# Patient Record
Sex: Female | Born: 1985 | Hispanic: No | Marital: Single | Smoking: Never smoker
Health system: Southern US, Community
[De-identification: ages and names within clinical notes are randomized; demographics above are authoritative.]

## PROBLEM LIST (undated history)

## (undated) DIAGNOSIS — IMO0002 Reserved for concepts with insufficient information to code with codable children: Secondary | ICD-10-CM

## (undated) DIAGNOSIS — R87619 Unspecified abnormal cytological findings in specimens from cervix uteri: Secondary | ICD-10-CM

## (undated) HISTORY — PX: WISDOM TOOTH EXTRACTION: SHX21

## (undated) HISTORY — DX: Unspecified abnormal cytological findings in specimens from cervix uteri: R87.619

## (undated) HISTORY — DX: Reserved for concepts with insufficient information to code with codable children: IMO0002

---

## 2012-11-13 ENCOUNTER — Other Ambulatory Visit (HOSPITAL_COMMUNITY): Payer: Self-pay | Admitting: Nurse Practitioner

## 2012-11-13 DIAGNOSIS — Z3682 Encounter for antenatal screening for nuchal translucency: Secondary | ICD-10-CM

## 2012-11-13 LAB — OB RESULTS CONSOLE ABO/RH: RH Type: POSITIVE

## 2012-11-13 LAB — OB RESULTS CONSOLE HIV ANTIBODY (ROUTINE TESTING): HIV: NONREACTIVE

## 2012-11-13 LAB — OB RESULTS CONSOLE GC/CHLAMYDIA: Chlamydia: NEGATIVE

## 2012-11-13 LAB — OB RESULTS CONSOLE RPR: RPR: NONREACTIVE

## 2012-11-21 ENCOUNTER — Ambulatory Visit (HOSPITAL_COMMUNITY): Admission: RE | Admit: 2012-11-21 | Payer: Medicaid Other | Source: Ambulatory Visit

## 2012-11-21 ENCOUNTER — Encounter (HOSPITAL_COMMUNITY): Payer: Self-pay

## 2012-11-21 ENCOUNTER — Ambulatory Visit (HOSPITAL_COMMUNITY)
Admission: RE | Admit: 2012-11-21 | Discharge: 2012-11-21 | Disposition: A | Discharge status: Home / Self Care | Payer: Medicaid Other | Source: Ambulatory Visit | Attending: Nurse Practitioner | Admitting: Nurse Practitioner

## 2012-11-21 ENCOUNTER — Other Ambulatory Visit (HOSPITAL_COMMUNITY): Payer: Self-pay | Admitting: Nurse Practitioner

## 2012-11-21 DIAGNOSIS — Z3682 Encounter for antenatal screening for nuchal translucency: Secondary | ICD-10-CM

## 2012-11-21 DIAGNOSIS — O351XX0 Maternal care for (suspected) chromosomal abnormality in fetus, not applicable or unspecified: Secondary | ICD-10-CM | POA: Insufficient documentation

## 2012-11-21 DIAGNOSIS — O3510X0 Maternal care for (suspected) chromosomal abnormality in fetus, unspecified, not applicable or unspecified: Secondary | ICD-10-CM | POA: Insufficient documentation

## 2012-11-21 DIAGNOSIS — Z3689 Encounter for other specified antenatal screening: Secondary | ICD-10-CM | POA: Insufficient documentation

## 2012-11-21 NOTE — Progress Notes (Signed)
Morgan Washington was seen for ultrasound appointment today.  Please see [facility] report for details.

## 2012-11-23 ENCOUNTER — Other Ambulatory Visit (HOSPITAL_COMMUNITY): Payer: Self-pay | Admitting: Maternal and Fetal Medicine

## 2012-11-23 DIAGNOSIS — Z3682 Encounter for antenatal screening for nuchal translucency: Secondary | ICD-10-CM

## 2012-11-24 ENCOUNTER — Ambulatory Visit (HOSPITAL_COMMUNITY)
Admission: RE | Admit: 2012-11-24 | Discharge: 2012-11-24 | Disposition: A | Discharge status: Home / Self Care | Payer: Medicaid Other | Source: Ambulatory Visit | Attending: Nurse Practitioner | Admitting: Nurse Practitioner

## 2012-11-24 ENCOUNTER — Other Ambulatory Visit: Payer: Self-pay

## 2012-11-24 DIAGNOSIS — O351XX0 Maternal care for (suspected) chromosomal abnormality in fetus, not applicable or unspecified: Secondary | ICD-10-CM | POA: Insufficient documentation

## 2012-11-24 DIAGNOSIS — O3510X0 Maternal care for (suspected) chromosomal abnormality in fetus, unspecified, not applicable or unspecified: Secondary | ICD-10-CM | POA: Insufficient documentation

## 2012-11-24 DIAGNOSIS — Z3682 Encounter for antenatal screening for nuchal translucency: Secondary | ICD-10-CM

## 2012-11-24 DIAGNOSIS — Z3689 Encounter for other specified antenatal screening: Secondary | ICD-10-CM | POA: Insufficient documentation

## 2012-12-13 NOTE — L&D Delivery Note (Signed)
Delivery Note At 7:07 PM a viable female was delivered via Vaginal, Spontaneous Delivery (Presentation: ; Occiput Anterior).  APGAR: 9, 9; weight pending.   Placenta status: Intact, Spontaneous.  Cord: 3 vessels with the following complications: None.  Cord pH: n/a  Anesthesia: Epidural  Episiotomy: None Lacerations: 1st degree;Perineal;Vaginal Suture Repair: 3.0 vicryl rapide Est. Blood Loss (mL): 350  Mom to postpartum.  Baby to nursery-stable.  Napoleon Form 06/10/2013, 7:59 PM

## 2012-12-25 ENCOUNTER — Other Ambulatory Visit: Payer: Self-pay | Admitting: Nurse Practitioner

## 2012-12-25 DIAGNOSIS — Z3689 Encounter for other specified antenatal screening: Secondary | ICD-10-CM

## 2013-01-03 ENCOUNTER — Ambulatory Visit (HOSPITAL_COMMUNITY)
Admission: RE | Admit: 2013-01-03 | Discharge: 2013-01-03 | Disposition: A | Discharge status: Home / Self Care | Payer: 59 | Source: Ambulatory Visit | Attending: Nurse Practitioner | Admitting: Nurse Practitioner

## 2013-01-03 DIAGNOSIS — Z363 Encounter for antenatal screening for malformations: Secondary | ICD-10-CM | POA: Insufficient documentation

## 2013-01-03 DIAGNOSIS — O358XX0 Maternal care for other (suspected) fetal abnormality and damage, not applicable or unspecified: Secondary | ICD-10-CM | POA: Insufficient documentation

## 2013-01-03 DIAGNOSIS — Z1389 Encounter for screening for other disorder: Secondary | ICD-10-CM | POA: Insufficient documentation

## 2013-01-03 DIAGNOSIS — Z3689 Encounter for other specified antenatal screening: Secondary | ICD-10-CM

## 2013-01-05 ENCOUNTER — Other Ambulatory Visit (HOSPITAL_COMMUNITY): Payer: Self-pay | Admitting: Nurse Practitioner

## 2013-01-05 DIAGNOSIS — O358XX0 Maternal care for other (suspected) fetal abnormality and damage, not applicable or unspecified: Secondary | ICD-10-CM

## 2013-01-17 ENCOUNTER — Ambulatory Visit (HOSPITAL_COMMUNITY)
Admission: RE | Admit: 2013-01-17 | Discharge: 2013-01-17 | Disposition: A | Discharge status: Home / Self Care | Payer: 59 | Source: Ambulatory Visit | Attending: Nurse Practitioner | Admitting: Nurse Practitioner

## 2013-01-17 DIAGNOSIS — O358XX0 Maternal care for other (suspected) fetal abnormality and damage, not applicable or unspecified: Secondary | ICD-10-CM

## 2013-01-17 DIAGNOSIS — Z363 Encounter for antenatal screening for malformations: Secondary | ICD-10-CM | POA: Insufficient documentation

## 2013-01-17 DIAGNOSIS — Z1389 Encounter for screening for other disorder: Secondary | ICD-10-CM | POA: Insufficient documentation

## 2013-05-10 LAB — OB RESULTS CONSOLE GBS: GBS: NEGATIVE

## 2013-05-30 ENCOUNTER — Inpatient Hospital Stay (HOSPITAL_COMMUNITY): Admission: AD | Admit: 2013-05-30 | Payer: Self-pay | Source: Ambulatory Visit | Admitting: Obstetrics & Gynecology

## 2013-05-30 ENCOUNTER — Encounter (HOSPITAL_COMMUNITY): Payer: Self-pay | Admitting: *Deleted

## 2013-05-30 ENCOUNTER — Telehealth (HOSPITAL_COMMUNITY): Payer: Self-pay | Admitting: *Deleted

## 2013-05-30 NOTE — Telephone Encounter (Signed)
Preadmission screen  

## 2013-05-31 ENCOUNTER — Other Ambulatory Visit (HOSPITAL_COMMUNITY): Payer: Self-pay | Admitting: Physician Assistant

## 2013-05-31 DIAGNOSIS — O480 Post-term pregnancy: Secondary | ICD-10-CM

## 2013-06-01 ENCOUNTER — Ambulatory Visit (INDEPENDENT_AMBULATORY_CARE_PROVIDER_SITE_OTHER): Payer: Medicaid Other | Admitting: *Deleted

## 2013-06-01 ENCOUNTER — Ambulatory Visit (HOSPITAL_COMMUNITY)
Admission: RE | Admit: 2013-06-01 | Discharge: 2013-06-01 | Disposition: A | Discharge status: Home / Self Care | Payer: Medicaid Other | Source: Ambulatory Visit | Attending: Obstetrics & Gynecology | Admitting: Obstetrics & Gynecology

## 2013-06-01 VITALS — BP 124/71 | Temp 97.7°F | Wt 203.9 lb

## 2013-06-01 DIAGNOSIS — O480 Post-term pregnancy: Secondary | ICD-10-CM

## 2013-06-01 DIAGNOSIS — Z3689 Encounter for other specified antenatal screening: Secondary | ICD-10-CM | POA: Insufficient documentation

## 2013-06-01 NOTE — Progress Notes (Signed)
P=83, here for NST for postdates

## 2013-06-07 ENCOUNTER — Encounter (HOSPITAL_COMMUNITY): Payer: Self-pay

## 2013-06-07 ENCOUNTER — Ambulatory Visit (HOSPITAL_COMMUNITY)
Admission: RE | Admit: 2013-06-07 | Discharge: 2013-06-07 | Disposition: A | Discharge status: Home / Self Care | Payer: 59 | Source: Ambulatory Visit | Attending: Physician Assistant | Admitting: Physician Assistant

## 2013-06-07 DIAGNOSIS — O480 Post-term pregnancy: Secondary | ICD-10-CM

## 2013-06-07 DIAGNOSIS — Z3689 Encounter for other specified antenatal screening: Secondary | ICD-10-CM | POA: Insufficient documentation

## 2013-06-09 ENCOUNTER — Encounter (HOSPITAL_COMMUNITY): Payer: Self-pay

## 2013-06-09 ENCOUNTER — Inpatient Hospital Stay (HOSPITAL_COMMUNITY)
Admission: RE | Admit: 2013-06-09 | Discharge: 2013-06-11 | DRG: 775 | Disposition: A | Discharge status: Home / Self Care | Payer: 59 | Source: Ambulatory Visit | Attending: Obstetrics and Gynecology | Admitting: Obstetrics and Gynecology

## 2013-06-09 DIAGNOSIS — O480 Post-term pregnancy: Principal | ICD-10-CM | POA: Diagnosis present

## 2013-06-09 LAB — CBC
MCHC: 34.5 g/dL (ref 30.0–36.0)
Platelets: 253 10*3/uL (ref 150–400)
RDW: 12.4 % (ref 11.5–15.5)

## 2013-06-09 LAB — TYPE AND SCREEN
ABO/RH(D): O POS
Antibody Screen: NEGATIVE

## 2013-06-09 LAB — COMPREHENSIVE METABOLIC PANEL
CO2: 21 mEq/L (ref 19–32)
Calcium: 9.8 mg/dL (ref 8.4–10.5)
Chloride: 101 mEq/L (ref 96–112)
Creatinine, Ser: 0.66 mg/dL (ref 0.50–1.10)
GFR calc Af Amer: >90 mL/min (ref >90)
GFR calc non Af Amer: >90 mL/min (ref >90)
Glucose, Bld: 82 mg/dL (ref 70–99)
Total Bilirubin: 0.3 mg/dL (ref 0.3–1.2)

## 2013-06-09 MED ORDER — PHENYLEPHRINE 40 MCG/ML (10ML) SYRINGE FOR IV PUSH (FOR BLOOD PRESSURE SUPPORT)
80.0000 ug | PREFILLED_SYRINGE | INTRAVENOUS | Status: DC | PRN
  Filled 2013-06-09: qty 5
  Filled 2013-06-09: qty 2

## 2013-06-09 MED ORDER — OXYCODONE-ACETAMINOPHEN 5-325 MG PO TABS
1.0000 | ORAL_TABLET | ORAL | Status: DC | PRN
  Administered 2013-06-10: 1 via ORAL
  Filled 2013-06-09: qty 1

## 2013-06-09 MED ORDER — IBUPROFEN 600 MG PO TABS: 600.0000 mg | ORAL_TABLET | Freq: Four times a day (QID) | ORAL | Status: DC | PRN

## 2013-06-09 MED ORDER — LACTATED RINGERS IV SOLN
500.0000 mL | INTRAVENOUS | Status: DC | PRN
  Administered 2013-06-10 (×2): 300 mL via INTRAVENOUS

## 2013-06-09 MED ORDER — DIPHENHYDRAMINE HCL 50 MG/ML IJ SOLN: 12.5000 mg | INTRAMUSCULAR | Status: DC | PRN

## 2013-06-09 MED ORDER — OXYTOCIN BOLUS FROM INFUSION
500.0000 mL | INTRAVENOUS | Status: DC
  Administered 2013-06-10: 500 mL via INTRAVENOUS

## 2013-06-09 MED ORDER — HYDROXYZINE HCL 50 MG PO TABS
50.0000 mg | ORAL_TABLET | Freq: Four times a day (QID) | ORAL | Status: DC | PRN
  Administered 2013-06-09: 50 mg via ORAL
  Filled 2013-06-09 (×2): qty 1

## 2013-06-09 MED ORDER — EPHEDRINE 5 MG/ML INJ
10.0000 mg | INTRAVENOUS | Status: DC | PRN
  Filled 2013-06-09: qty 2
  Filled 2013-06-09: qty 4

## 2013-06-09 MED ORDER — LACTATED RINGERS IV SOLN
500.0000 mL | Freq: Once | INTRAVENOUS | Status: AC
  Administered 2013-06-10: 500 mL via INTRAVENOUS

## 2013-06-09 MED ORDER — LACTATED RINGERS IV SOLN
INTRAVENOUS | Status: DC
  Administered 2013-06-09 – 2013-06-10 (×5): via INTRAVENOUS

## 2013-06-09 MED ORDER — LIDOCAINE HCL (PF) 1 % IJ SOLN
30.0000 mL | INTRAMUSCULAR | Status: DC | PRN
  Administered 2013-06-10: 30 mL via SUBCUTANEOUS
  Filled 2013-06-09 (×2): qty 30

## 2013-06-09 MED ORDER — OXYTOCIN 40 UNITS IN LACTATED RINGERS INFUSION - SIMPLE MED: 62.5000 mL/h | INTRAVENOUS | Status: DC

## 2013-06-09 MED ORDER — EPHEDRINE 5 MG/ML INJ
10.0000 mg | INTRAVENOUS | Status: DC | PRN
Start: 2013-06-09 — End: 2013-06-10
  Filled 2013-06-09: qty 2

## 2013-06-09 MED ORDER — PHENYLEPHRINE 40 MCG/ML (10ML) SYRINGE FOR IV PUSH (FOR BLOOD PRESSURE SUPPORT)
80.0000 ug | PREFILLED_SYRINGE | INTRAVENOUS | Status: DC | PRN
  Filled 2013-06-09: qty 2

## 2013-06-09 MED ORDER — FENTANYL CITRATE 0.05 MG/ML IJ SOLN: 100.0000 ug | INTRAMUSCULAR | Status: DC | PRN

## 2013-06-09 MED ORDER — FLEET ENEMA 7-19 GM/118ML RE ENEM: 1.0000 | ENEMA | RECTAL | Status: DC | PRN

## 2013-06-09 MED ORDER — FENTANYL 2.5 MCG/ML BUPIVACAINE 1/10 % EPIDURAL INFUSION (WH - ANES)
14.0000 mL/h | INTRAMUSCULAR | Status: DC | PRN
  Administered 2013-06-10: 16 mL/h via EPIDURAL
  Filled 2013-06-09: qty 125

## 2013-06-09 MED ORDER — ONDANSETRON HCL 4 MG/2ML IJ SOLN: 4.0000 mg | Freq: Four times a day (QID) | INTRAMUSCULAR | Status: DC | PRN

## 2013-06-09 MED ORDER — ACETAMINOPHEN 325 MG PO TABS: 650.0000 mg | ORAL_TABLET | ORAL | Status: DC | PRN

## 2013-06-09 MED ORDER — CITRIC ACID-SODIUM CITRATE 334-500 MG/5ML PO SOLN: 30.0000 mL | ORAL | Status: DC | PRN

## 2013-06-09 NOTE — H&P (Signed)
Morgan Washington is a 27 y.o. G2P0010 female at [redacted]w[redacted]d by LMP which correlates well w/ 12wk u/s, presenting for IOL d/t postdates. She initiated pnc at [facility] @ 11.6wks, genetic screening neg, early 1hr glucola 82, repeat 111, anatomy u/s normal, gbs neg. Reports good fm, states uc's began when she got here. Denies ha, scotomata, ruq/epigastric pain, n/v.  Rubella is non-immune.   Maternal Medical History:  Fetal activity: Perceived fetal activity is normal.   Last perceived fetal movement was within the past hour.    Prenatal complications: no prenatal complications Prenatal Complications - Diabetes: none.    OB History   Grav Para Term Preterm Abortions TAB SAB Ect Mult Living   2 0 0 0 1 1 0 0 0 0      Past Medical History  Diagnosis Date  . Abnormal Pap smear    Past Surgical History  Procedure Laterality Date  . Wisdom tooth extraction     Family History: family history includes Cancer in her maternal grandfather and paternal grandfather; Deep vein thrombosis in her mother; Mental illness in her paternal grandmother; Peripheral vascular disease in her maternal grandmother, mother, and paternal grandmother; and Urinary tract infection in her mother and paternal grandmother. Social History:  reports that she has never smoked. She has never used smokeless tobacco. She reports that she does not drink alcohol or use illicit drugs.   Review of Systems  Constitutional: Negative.   HENT: Negative.   Eyes: Negative.   Respiratory: Negative.   Cardiovascular: Negative.   Gastrointestinal: Positive for abdominal pain (some uc's).  Genitourinary: Negative.   Musculoskeletal: Negative.   Skin: Negative.   Neurological: Negative.   Endo/Heme/Allergies: Negative.   Psychiatric/Behavioral: Negative.       Blood pressure 142/89, pulse 72, temperature 98 F (36.7 C), temperature source Oral, resp. rate 18, height 6\' 1"  (1.854 m), weight 92.67 kg (204 lb 4.8 oz), last menstrual period  08/23/2012. Maternal Exam:  Uterine Assessment: Contraction strength is mild.  Contraction frequency is regular.   Abdomen: Patient reports no abdominal tenderness. Fetal presentation: vertex  Introitus: Normal vulva. Normal vagina.  Pelvis: adequate for delivery.   Cervix: Cervix evaluated by digital exam.     Fetal Exam Fetal Monitor Review: Mode: ultrasound.   Baseline rate: 140.  Variability: moderate (6-25 bpm).   Pattern: accelerations present and variable decelerations.    Fetal State Assessment: Category II - tracings are indeterminate.     Physical Exam  Constitutional: She is oriented to person, place, and time. She appears well-developed and well-nourished.  HENT:  Head: Normocephalic.  Neck: Normal range of motion.  Cardiovascular: Normal rate and regular rhythm.   Respiratory: Effort normal and breath sounds normal.  GI: Soft.  gravid  Genitourinary: Vagina normal and uterus normal.  SVE: 1.5/80/-3, post, vtx Cervical foley bulb inserted and inflated w/ 60ml LR w/o difficulty   Musculoskeletal: Normal range of motion.  Neurological: She is alert and oriented to person, place, and time. She has normal reflexes.  Skin: Skin is warm and dry.  Psychiatric: She has a normal mood and affect. Her behavior is normal. Judgment and thought content normal.    Prenatal labs: ABO, Rh: B/Positive/-- (12/02 0000) Antibody: Negative (12/02 0000) Rubella: Nonimmune (12/02 0000) RPR: Nonreactive (12/02 0000)  HBsAg: Negative (12/02 0000)  HIV: Non-reactive (12/02 0000)  GBS: Negative (05/29 0000)   Assessment/Plan: A:  [redacted]w[redacted]d SIUP  G2P0010   IOL postdates  Cat II FHR  GBS neg  P:  Admit to BS  Foley bulb in place, begin pitocin per protocol when it falls out  Light reg diet until pitocin/active labor/srom, whichever first  IV pain meds prn/epidural prn active labor  CBC, CMP, urine P/C ratio  Anticipate NSVD  MMR pp  Marge Duncans 06/09/2013, 8:31  PM

## 2013-06-10 ENCOUNTER — Encounter (HOSPITAL_COMMUNITY): Payer: Self-pay | Admitting: Anesthesiology

## 2013-06-10 ENCOUNTER — Encounter (HOSPITAL_COMMUNITY): Payer: Self-pay

## 2013-06-10 ENCOUNTER — Inpatient Hospital Stay (HOSPITAL_COMMUNITY): Payer: 59 | Admitting: Anesthesiology

## 2013-06-10 DIAGNOSIS — O480 Post-term pregnancy: Secondary | ICD-10-CM

## 2013-06-10 LAB — PROTEIN / CREATININE RATIO, URINE
Protein Creatinine Ratio: 0.17 — ABNORMAL HIGH (ref 0.00–0.15)
Total Protein, Urine: 8.7 mg/dL

## 2013-06-10 MED ORDER — ONDANSETRON HCL 4 MG/2ML IJ SOLN: 4.0000 mg | INTRAMUSCULAR | Status: DC | PRN

## 2013-06-10 MED ORDER — BENZOCAINE-MENTHOL 20-0.5 % EX AERO
1.0000 "application " | INHALATION_SPRAY | CUTANEOUS | Status: DC | PRN
  Administered 2013-06-10: 1 via TOPICAL
  Filled 2013-06-10: qty 56

## 2013-06-10 MED ORDER — PRENATAL MULTIVITAMIN CH
1.0000 | ORAL_TABLET | Freq: Every day | ORAL | Status: DC
  Administered 2013-06-11: 1 via ORAL
  Filled 2013-06-10: qty 1

## 2013-06-10 MED ORDER — MEASLES, MUMPS & RUBELLA VAC ~~LOC~~ INJ
0.5000 mL | INJECTION | Freq: Once | SUBCUTANEOUS | Status: AC
  Administered 2013-06-11: 0.5 mL via SUBCUTANEOUS
  Filled 2013-06-10: qty 0.5

## 2013-06-10 MED ORDER — TERBUTALINE SULFATE 1 MG/ML IJ SOLN: 0.2500 mg | Freq: Once | INTRAMUSCULAR | Status: DC | PRN

## 2013-06-10 MED ORDER — WITCH HAZEL-GLYCERIN EX PADS: 1.0000 "application " | MEDICATED_PAD | CUTANEOUS | Status: DC | PRN

## 2013-06-10 MED ORDER — LANOLIN HYDROUS EX OINT: TOPICAL_OINTMENT | CUTANEOUS | Status: DC | PRN

## 2013-06-10 MED ORDER — DIBUCAINE 1 % RE OINT: 1.0000 "application " | TOPICAL_OINTMENT | RECTAL | Status: DC | PRN

## 2013-06-10 MED ORDER — FERROUS SULFATE 325 (65 FE) MG PO TABS
325.0000 mg | ORAL_TABLET | Freq: Two times a day (BID) | ORAL | Status: DC
  Administered 2013-06-11 (×2): 325 mg via ORAL
  Filled 2013-06-10 (×2): qty 1

## 2013-06-10 MED ORDER — LIDOCAINE HCL (PF) 1 % IJ SOLN
INTRAMUSCULAR | Status: DC | PRN
  Administered 2013-06-10: 4 mL
  Administered 2013-06-10: 5 mL
  Administered 2013-06-10 (×2): 4 mL

## 2013-06-10 MED ORDER — OXYCODONE-ACETAMINOPHEN 5-325 MG PO TABS
1.0000 | ORAL_TABLET | ORAL | Status: DC | PRN
  Administered 2013-06-11: 1 via ORAL
  Administered 2013-06-11 (×2): 2 via ORAL
  Administered 2013-06-11: 1 via ORAL
  Filled 2013-06-10: qty 1
  Filled 2013-06-10: qty 2
  Filled 2013-06-10: qty 1
  Filled 2013-06-10: qty 2

## 2013-06-10 MED ORDER — FENTANYL CITRATE 0.05 MG/ML IJ SOLN: 100.0000 ug | INTRAMUSCULAR | Status: DC | PRN

## 2013-06-10 MED ORDER — FLEET ENEMA 7-19 GM/118ML RE ENEM: 1.0000 | ENEMA | Freq: Every day | RECTAL | Status: DC | PRN

## 2013-06-10 MED ORDER — SENNOSIDES-DOCUSATE SODIUM 8.6-50 MG PO TABS
2.0000 | ORAL_TABLET | Freq: Every day | ORAL | Status: DC
  Administered 2013-06-10: 2 via ORAL

## 2013-06-10 MED ORDER — OXYTOCIN 40 UNITS IN LACTATED RINGERS INFUSION - SIMPLE MED: 62.5000 mL/h | INTRAVENOUS | Status: DC | PRN

## 2013-06-10 MED ORDER — DIPHENHYDRAMINE HCL 25 MG PO CAPS: 25.0000 mg | ORAL_CAPSULE | Freq: Four times a day (QID) | ORAL | Status: DC | PRN

## 2013-06-10 MED ORDER — IBUPROFEN 600 MG PO TABS
600.0000 mg | ORAL_TABLET | Freq: Four times a day (QID) | ORAL | Status: DC
  Administered 2013-06-11 (×4): 600 mg via ORAL
  Filled 2013-06-10 (×4): qty 1

## 2013-06-10 MED ORDER — SIMETHICONE 80 MG PO CHEW: 80.0000 mg | CHEWABLE_TABLET | ORAL | Status: DC | PRN

## 2013-06-10 MED ORDER — OXYTOCIN 40 UNITS IN LACTATED RINGERS INFUSION - SIMPLE MED
1.0000 m[IU]/min | INTRAVENOUS | Status: DC
  Administered 2013-06-10: 2 m[IU]/min via INTRAVENOUS
  Filled 2013-06-10: qty 1000

## 2013-06-10 MED ORDER — ZOLPIDEM TARTRATE 5 MG PO TABS: 5.0000 mg | ORAL_TABLET | Freq: Every evening | ORAL | Status: DC | PRN

## 2013-06-10 MED ORDER — TETANUS-DIPHTH-ACELL PERTUSSIS 5-2.5-18.5 LF-MCG/0.5 IM SUSP: 0.5000 mL | Freq: Once | INTRAMUSCULAR | Status: DC

## 2013-06-10 MED ORDER — ONDANSETRON HCL 4 MG PO TABS: 4.0000 mg | ORAL_TABLET | ORAL | Status: DC | PRN

## 2013-06-10 MED ORDER — BISACODYL 10 MG RE SUPP: 10.0000 mg | Freq: Every day | RECTAL | Status: DC | PRN

## 2013-06-10 MED ORDER — NALBUPHINE SYRINGE 5 MG/0.5 ML
10.0000 mg | INJECTION | INTRAMUSCULAR | Status: DC | PRN
  Filled 2013-06-10: qty 1

## 2013-06-10 NOTE — Progress Notes (Signed)
Thad Ranger, MD, notified that pt just finished eating a light breakfast. RN to wait an hour to start pitocin.

## 2013-06-10 NOTE — Progress Notes (Signed)
Patient ID: Morgan Washington, female   DOB: 1986/01/30, 27 y.o.   MRN: [id number]  S:  Comfortable with epidural  O:   Filed Vitals:   06/10/13 1442 06/10/13 1501 06/10/13 1531 06/10/13 1601  BP:  116/63 122/77 119/74  Pulse: 65 71 63 67  Temp:      TempSrc:      Resp:   20 18  Height:      Weight:      SpO2: 99%       Dilation: 6.5 Effacement (%): 90 Cervical Position: Middle Station: -1 Presentation: Vertex Exam by:: Thad Ranger, MD  AROM, clear  FHTs:  140, mod var, few accels, variable decel during check/AROM TOCO:  q 2-4 with 5-6 min gap every few ctx  A/P 27 y.o. G2P0010 at [redacted]w[redacted]d with IOL for post-dates, small progression on pitocin since restarting - FHTs cat II - AROM, clear - Continue to increase pitocin - Anticipate SVD  Napoleon Form, MD

## 2013-06-10 NOTE — Progress Notes (Signed)
Patient ID: Morgan Washington, female   DOB: 1986-08-17, 27 y.o.   MRN: [id number]  S:  Pt comfortable, not really contracting off pitocin. Has eaten and showered  O:   Filed Vitals:   06/09/13 2256 06/10/13 0149 06/10/13 0620 06/10/13 0841  BP: 131/80 131/76 125/71   Pulse: 69 67 62   Temp: 97.7 F (36.5 C) 97.8 F (36.6 C) 98 F (36.7 C)   TempSrc: Oral Oral Oral   Resp: 18 18 18 18   Height:      Weight:       Cervix:  6/70/-2, soft, middle  FHTs: 140, mod var, accels present, no decels  A./P 27 y.o. G2P0010 at [redacted]w[redacted]d here for IOL for post-dates - FHTs reactive - Cervix 6 cm after FB - Restart pitocin - Epidural when desired - Anticipate SVD  Napoleon Form, MD

## 2013-06-10 NOTE — Progress Notes (Signed)
Morgan Washington is a 27 y.o. G2P0010 at [redacted]w[redacted]d admitted for induction of labor due to postdates.  Subjective: Comfortable, no complaints. Wants to eat light meal before pitocin started.  Objective: BP 125/71  Pulse 62  Temp(Src) 98 F (36.7 C) (Oral)  Resp 18  Ht 6\' 1"  (1.854 m)  Wt 92.67 kg (204 lb 4.8 oz)  BMI 26.96 kg/m2  LMP 08/23/2012      FHT:  FHR: 130 bpm, variability: minimal ,  accelerations:  Present,  decelerations:  Absent UC:   irregular, every 3-6 minutes SVE:   Dilation: 6 Effacement (%): 70 Station: -3 Exam by:: [email or web address], cnm Foley bulb sitting in vagina, removed.   Labs: Lab Results  Component Value Date   WBC 11.4* 06/09/2013   HGB 13.3 06/09/2013   HCT 38.6 06/09/2013   MCV 88.1 06/09/2013   PLT 253 06/09/2013    Assessment / Plan: IOL d/t postdates, s/p cervical ripening phase, pt requests to eat light breakfast, then will start pitocin per protocol  Labor: n/a Preeclampsia:  no s/s , labs normal Fetal Wellbeing:  Category II Pain Control:  Labor support without medications I/D:  n/a Anticipated MOD:  NSVD  Marge Duncans 06/10/2013, 6:48 AM

## 2013-06-10 NOTE — Progress Notes (Signed)
Patient ID: Morgan Washington, female   DOB: Jun 29, 1986, 29 y.o.   MRN: [id number]   S:  Called to room by RN for fetal heartrate decel following epidural placement. Pt comfortable, lying on left side  O:   Filed Vitals:   06/10/13 1416 06/10/13 1417 06/10/13 1421 06/10/13 1422  BP:  129/75 124/81   Pulse: 59 63 63 65  Temp:      TempSrc:      Resp:      Height:      Weight:      SpO2:  99%  100%    Dilation: 6 Effacement (%): 70 Cervical Position: Middle Station: -2 Presentation: Vertex Exam by:: Thad Ranger, MD  FHTs:  120s, mod var, accels present with prolonged decel to 90s for 3-4 minutes. Tracing not continuous, worst of decel appeared to be during contractions with some increase to 110-120s between ctx.   Pitocin turned off, FHTs recovered to 140-150s.  A/P 27 y.o. G2P0010 at 102w4d here for IOL for postdates.  On pitocin, no cervical change for 4 hours. Was just getting uncomfortable enough to want epidural. Will restart pitocin when FHTs recover variability and anticipate progression. AROM if no change by next check  Napoleon Form, MD

## 2013-06-10 NOTE — H&P (Signed)
Attestation of Attending Supervision of Advanced Practitioner (CNM/NP): Evaluation and management procedures were performed by the Advanced Practitioner under my supervision and collaboration.  I have reviewed the Advanced Practitioner's note and chart, and I agree with the management and plan.  Kamrin Spath 06/10/2013 6:19 AM

## 2013-06-10 NOTE — Anesthesia Preprocedure Evaluation (Signed)

## 2013-06-10 NOTE — Anesthesia Procedure Notes (Signed)
Epidural Patient location during procedure: OB Start time: 06/10/2013 1:56 PM  Staffing Performed by: anesthesiologist   Preanesthetic Checklist Completed: patient identified, site marked, surgical consent, pre-op evaluation, timeout performed, IV checked, risks and benefits discussed and monitors and equipment checked  Epidural Patient position: sitting Prep: site prepped and draped and DuraPrep Patient monitoring: continuous pulse ox and blood pressure Approach: midline Injection technique: LOR air  Needle:  Needle type: Tuohy  Needle gauge: 17 G Needle length: 9 cm and 9 Needle insertion depth: 5 cm cm Catheter type: closed end flexible Catheter size: 19 Gauge Catheter at skin depth: 10 cm Test dose: negative  Assessment Events: blood not aspirated, injection not painful, no injection resistance, negative IV test and no paresthesia  Additional Notes Discussed risk of headache, infection, bleeding, nerve injury and failed or incomplete block.  Patient voices understanding and wishes to proceed.  Epidural placed easily on first attempt. No paresthesia. Patient tolerated procedure well with no apparent complications.  Jasmine December, MD Reason for block:procedure for pain

## 2013-06-11 MED ORDER — OXYCODONE-ACETAMINOPHEN 5-325 MG PO TABS: 1.0000 | ORAL_TABLET | ORAL | Status: AC | PRN

## 2013-06-11 MED ORDER — FERROUS SULFATE 325 (65 FE) MG PO TABS: 325.0000 mg | ORAL_TABLET | Freq: Every day | ORAL | Status: AC

## 2013-06-11 MED ORDER — IBUPROFEN 600 MG PO TABS: 600.0000 mg | ORAL_TABLET | Freq: Four times a day (QID) | ORAL | Status: AC

## 2013-06-11 MED ORDER — SENNOSIDES-DOCUSATE SODIUM 8.6-50 MG PO TABS: 2.0000 | ORAL_TABLET | Freq: Every day | ORAL | Status: AC

## 2013-06-11 MED ORDER — NORETHINDRONE 0.35 MG PO TABS: 1.0000 | ORAL_TABLET | Freq: Every day | ORAL | Status: AC
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
## 2013-06-11 NOTE — Anesthesia Postprocedure Evaluation (Signed)
  Anesthesia Post-op Note  Patient: Morgan Washington  Procedure(s) Performed: * No procedures listed *  Patient Location: PACU and Mother/Baby  Anesthesia Type:Epidural  Level of Consciousness: awake  Airway and Oxygen Therapy: Patient Spontanous Breathing  Post-op Pain: none  Post-op Assessment: Patient's Cardiovascular Status Stable, Respiratory Function Stable, Patent Airway, No signs of Nausea or vomiting, Adequate PO intake, Pain level controlled, No headache, No backache, No residual numbness and No residual motor weakness  Post-op Vital Signs: Reviewed and stable  Complications: No apparent anesthesia complications

## 2013-06-11 NOTE — Discharge Summary (Signed)
Obstetric Discharge Summary Reason for Admission: induction of labor for post-dates Prenatal Procedures: none Intrapartum Procedures: spontaneous vaginal delivery Postpartum Procedures: none Complications-Operative and Postpartum: 1st degree perineal laceration Hemoglobin  Date Value Range Status  06/09/2013 13.3  12.0 - 15.0 g/dL Final     HCT  Date Value Range Status  06/09/2013 38.6  36.0 - 46.0 % Final    Physical Exam:  General: alert, cooperative and no distress Lochia: appropriate Uterine Fundus: firm Incision: n/a DVT Evaluation: No evidence of DVT seen on physical exam. Negative Homan's sign. No cords or calf tenderness. No significant calf/ankle edema.  Discharge Diagnoses: Term Pregnancy-delivered  Discharge Information: Date: 06/11/2013 Activity: pelvic rest Diet: routine Medications: PNV, Ibuprofen, Colace, Iron and Percocet Condition: stable Instructions: refer to practice specific booklet Discharge to: home Follow-up Information   Follow up with [facility] [place]. Schedule an appointment as soon as possible for a visit in 6 weeks. (For postpartum visit)    Contact information:   [address] [place] [address] [phone]      Newborn Data: Live born female  Birth Weight: 7 lb 14.6 oz (3590 g) APGAR: 9, 9  Home with mother.  Napoleon Form 06/11/2013, 9:11 AM

## 2013-06-11 NOTE — Progress Notes (Signed)
Post Partum Day 1 Subjective: no complaints, up ad lib, voiding, tolerating PO and + flatus  Objective: Blood pressure 125/65, pulse 58, temperature 97.2 F (36.2 C), temperature source Oral, resp. rate 19, height 6\' 1"  (1.854 m), weight 204 lb 4.8 oz (92.67 kg), last menstrual period 08/23/2012, SpO2 98.00%, unknown if currently breastfeeding.  Physical Exam:  General: alert, cooperative and no distress Lochia: appropriate Uterine Fundus: firm  DVT Evaluation: No evidence of DVT seen on physical exam.   Recent Labs  06/09/13 2000  HGB 13.3  HCT 38.6    Assessment/Plan: Plan for discharge tomorrow, Breastfeeding and Contraception Micronor   LOS: 2 days   Esmeralda Malay H 06/11/2013, 7:12 AM

## 2013-06-15 NOTE — Progress Notes (Signed)
NST reeactive on 06/01/13

## 2013-11-28 IMAGING — US US OB FOLLOW-UP
1 series · 12 of 28 positions shown · non-contrast
Comparison: none

[Series 1: us ob follow up · 12 of 46 slices shown]
[im 2/46]
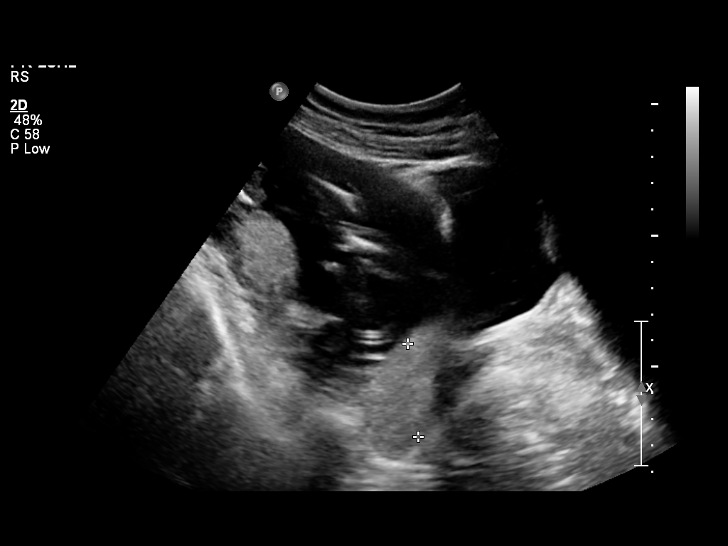
[im 6/46]
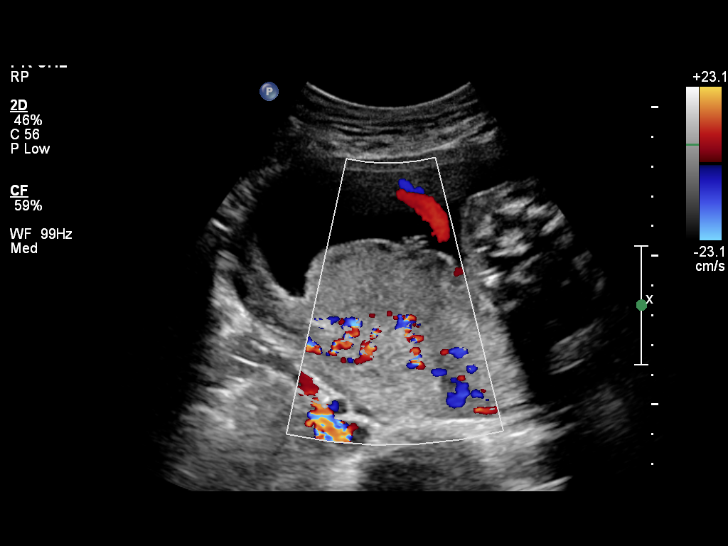
[im 9/46]
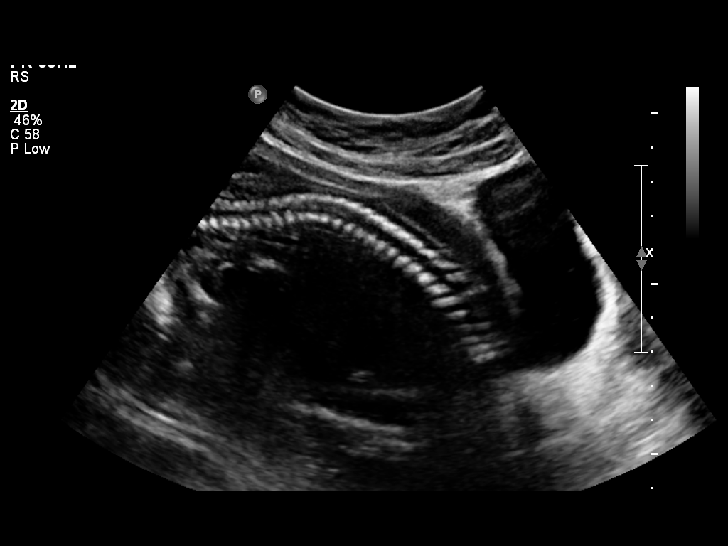
[im 14/46]
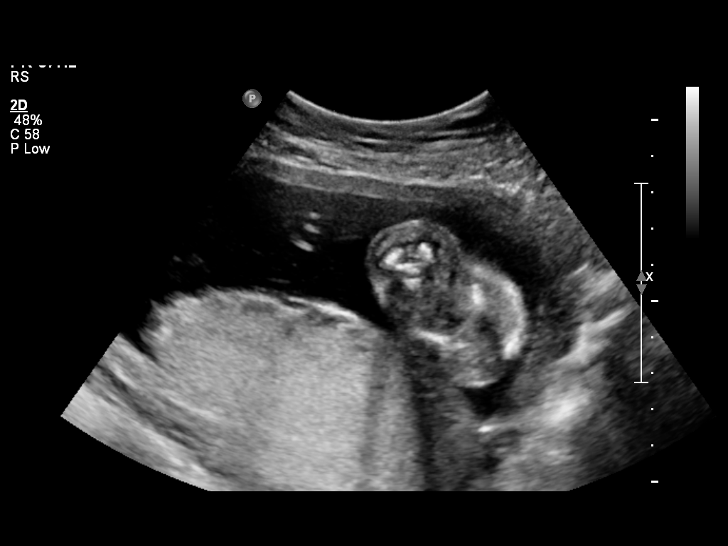
[im 17/46]
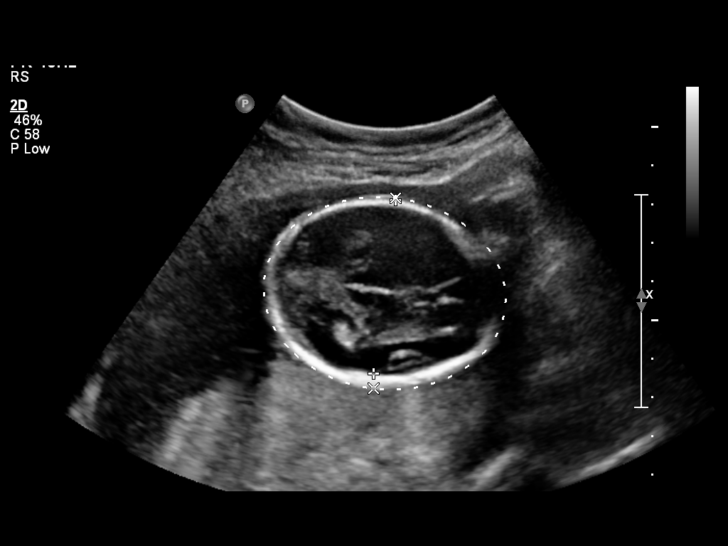
[im 21/46]
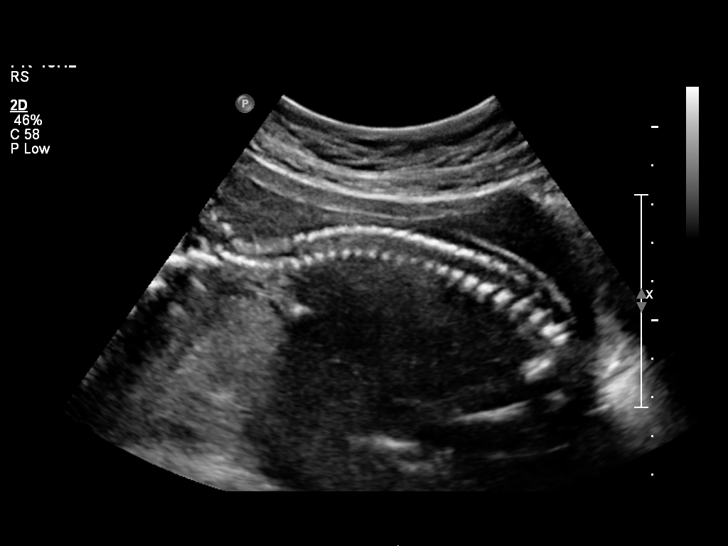
[im 26/46]
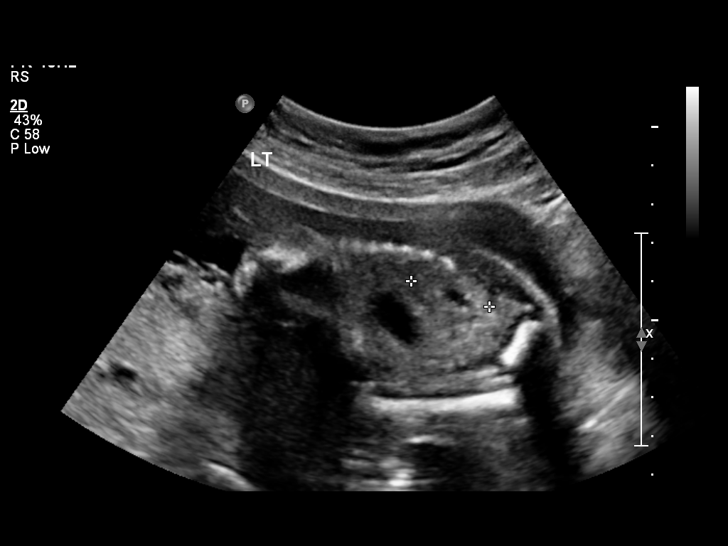
[im 29/46]
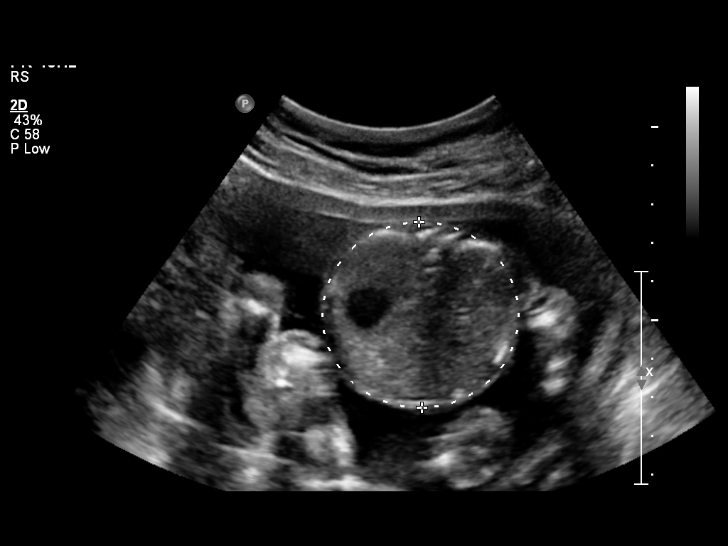
[im 32/46]
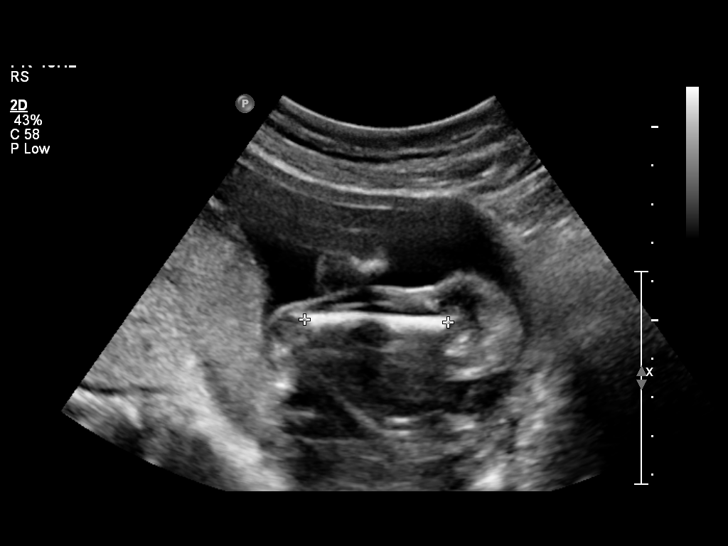
[im 37/46]
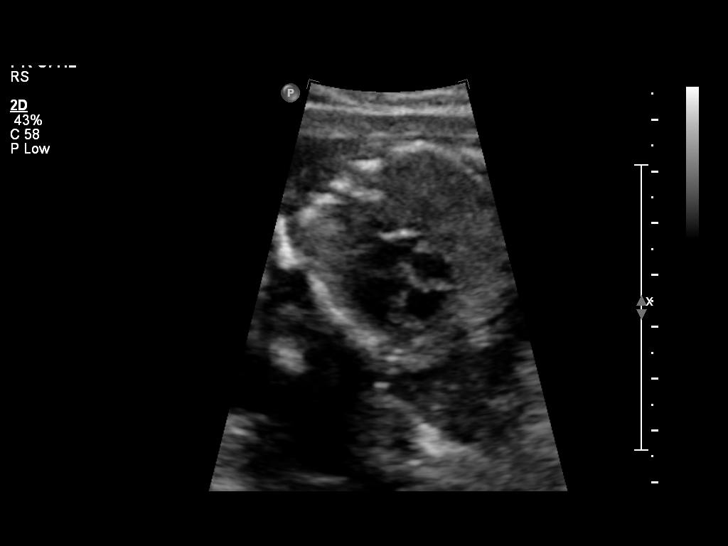
[im 41/46]
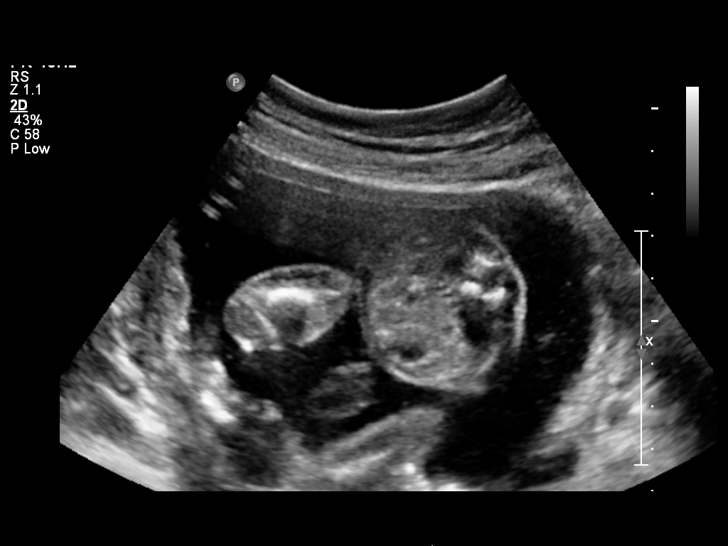
[im 44/46]
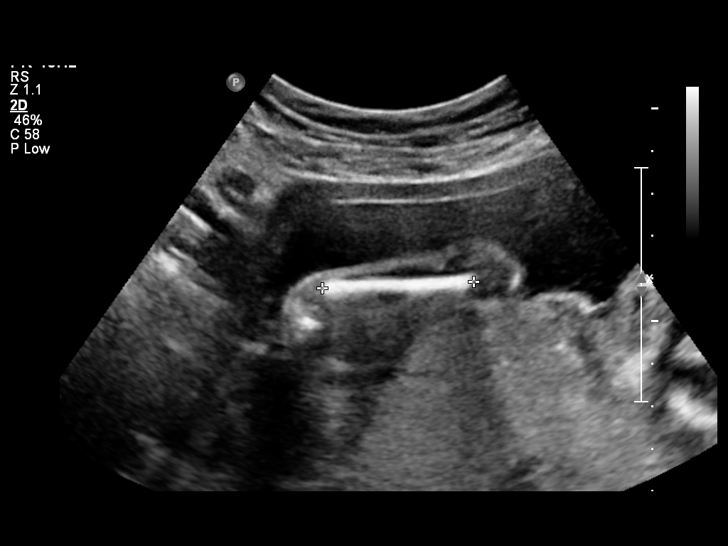

[12 of 28 positions shown; findings below may reference images not displayed]

OBSTETRICS REPORT
                      (Signed Final 01/17/2013 [DATE])

Service(s) Provided

 US OB FOLLOW UP                                       76816.1
Indications

 Follow-up incomplete fetal anatomic evaluation
Fetal Evaluation

 Num Of Fetuses:    1
 Fetal Heart Rate:  147                         bpm
 Cardiac Activity:  Observed
 Presentation:      Breech
 Placenta:          Posterior, above cervical
                    os
 P. Cord            Previously Visualized
 Insertion:

 Amniotic Fluid
 AFI FV:      Subjectively within normal limits
                                             Larg Pckt:     4.6  cm
Biometry

 BPD:     45.9  mm    G. Age:   19w 6d                CI:         69.6   70 - 86
                                                      FL/HC:      21.2   15.9 -

 HC:     175.6  mm    G. Age:   20w 0d        9  %    HC/AC:      1.12   1.06 -

 AC:     156.1  mm    G. Age:   20w 6d       36  %    FL/BPD:
 FL:      37.2  mm    G. Age:   21w 6d       70  %    FL/AC:      23.8   20 - 24
 HUM:     34.8  mm    G. Age:   21w 6d       70  %

 Est. FW:     399  gm    0 lb 14 oz      47  %
Gestational Age

 LMP:           21w 0d       Date:   08/23/12                 EDD:   05/30/13
 U/S Today:     20w 5d                                        EDD:   06/01/13
 Best:          21w 0d    Det. By:   LMP  (08/23/12)          EDD:   05/30/13
Anatomy

 Cranium:          Appears normal         Aortic Arch:      Previously seen
 Fetal Cavum:      Previously seen        Ductal Arch:      Previously seen
 Ventricles:       Appears normal         Diaphragm:        Appears normal
 Choroid Plexus:   Previously seen        Stomach:          Appears normal, left
                                                            sided
 Cerebellum:       Previously seen        Abdomen:          Appears normal
 Posterior Fossa:  Previously seen        Abdominal Wall:   Appears nml (cord
                                                            insert, abd wall)
 Nuchal Fold:      Previously seen        Cord Vessels:     Appears normal (3
                                                            vessel cord)
 Face:             Orbits appear          Kidneys:          Appear normal
                   normal
 Lips:             Previously seen        Bladder:          Appears normal
 Heart:            Appears normal         Spine:            Appears normal
                   (4CH, axis, and
                   situs)
 RVOT:             Not well visualized    Lower             Previously seen
                                          Extremities:
 LVOT:             Appears normal         Upper             Previously seen
                                          Extremities:

 Other:  Fetus appears to be a female. Heels visualized. 5th digit visualized.
         Technically difficult due to fetal position. Nasal bone visualized.
Cervix Uterus Adnexa

 Cervical Length:   3.5       cm

 Cervix:       Normal appearance by transabdominal scan.
 Uterus:       No abnormality visualized.

 Left Ovary:   No adnexal mass visualized.
 Right Ovary:  No adnexal mass visualized.
 Adnexa:     No abnormality visualized.
Impression

 Single live IUP in breech presentation.   Concordant
 measurements/assigned GA by LMP.
 No anomaly seen in visualized structures as listed above.
 However, RVOT remains not well visualized due to fetal
 position.

 questions or concerns.

## 2014-10-14 ENCOUNTER — Encounter (HOSPITAL_COMMUNITY): Payer: Self-pay
